# Patient Record
Sex: Male | Born: 1995 | Race: Black or African American | Hispanic: No | Marital: Married | Smoking: Never smoker
Health system: Southern US, Community
[De-identification: ages and names within clinical notes are randomized; demographics above are authoritative.]

---
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
## 2015-12-13 IMAGING — CR DG ANKLE COMPLETE 3+V*R*
3 series · 3 of 3 positions shown · non-contrast
Comparison: Foot radiographs dated 08/09/2010

CLINICAL DATA: Lateral right ankle pain secondary to an injury
while playing basketball yesterday.

EXAM:
RIGHT ANKLE - COMPLETE 3+ VIEW

[ankle ap]
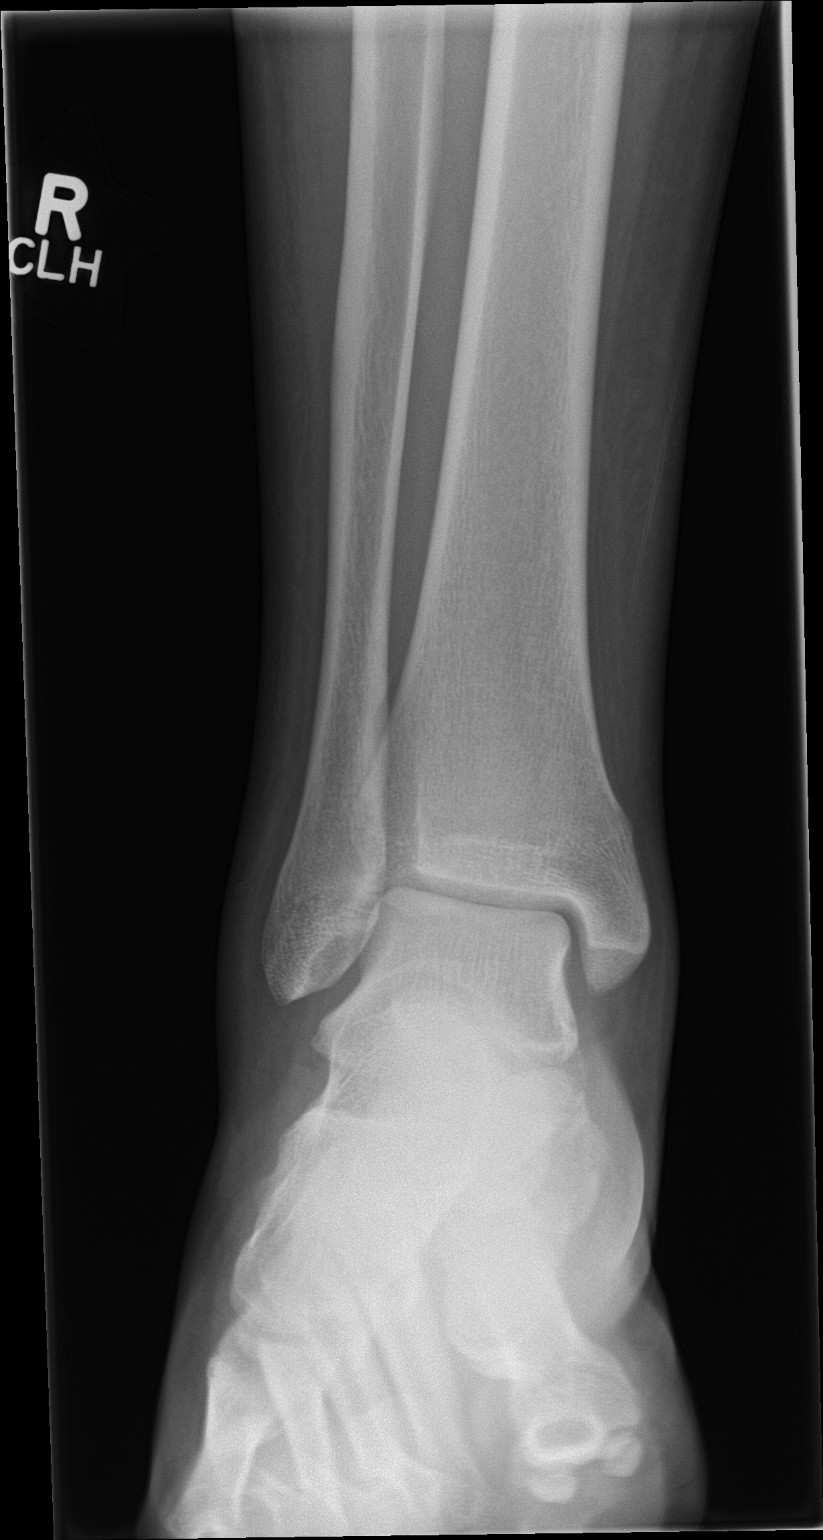

[ankle obl]
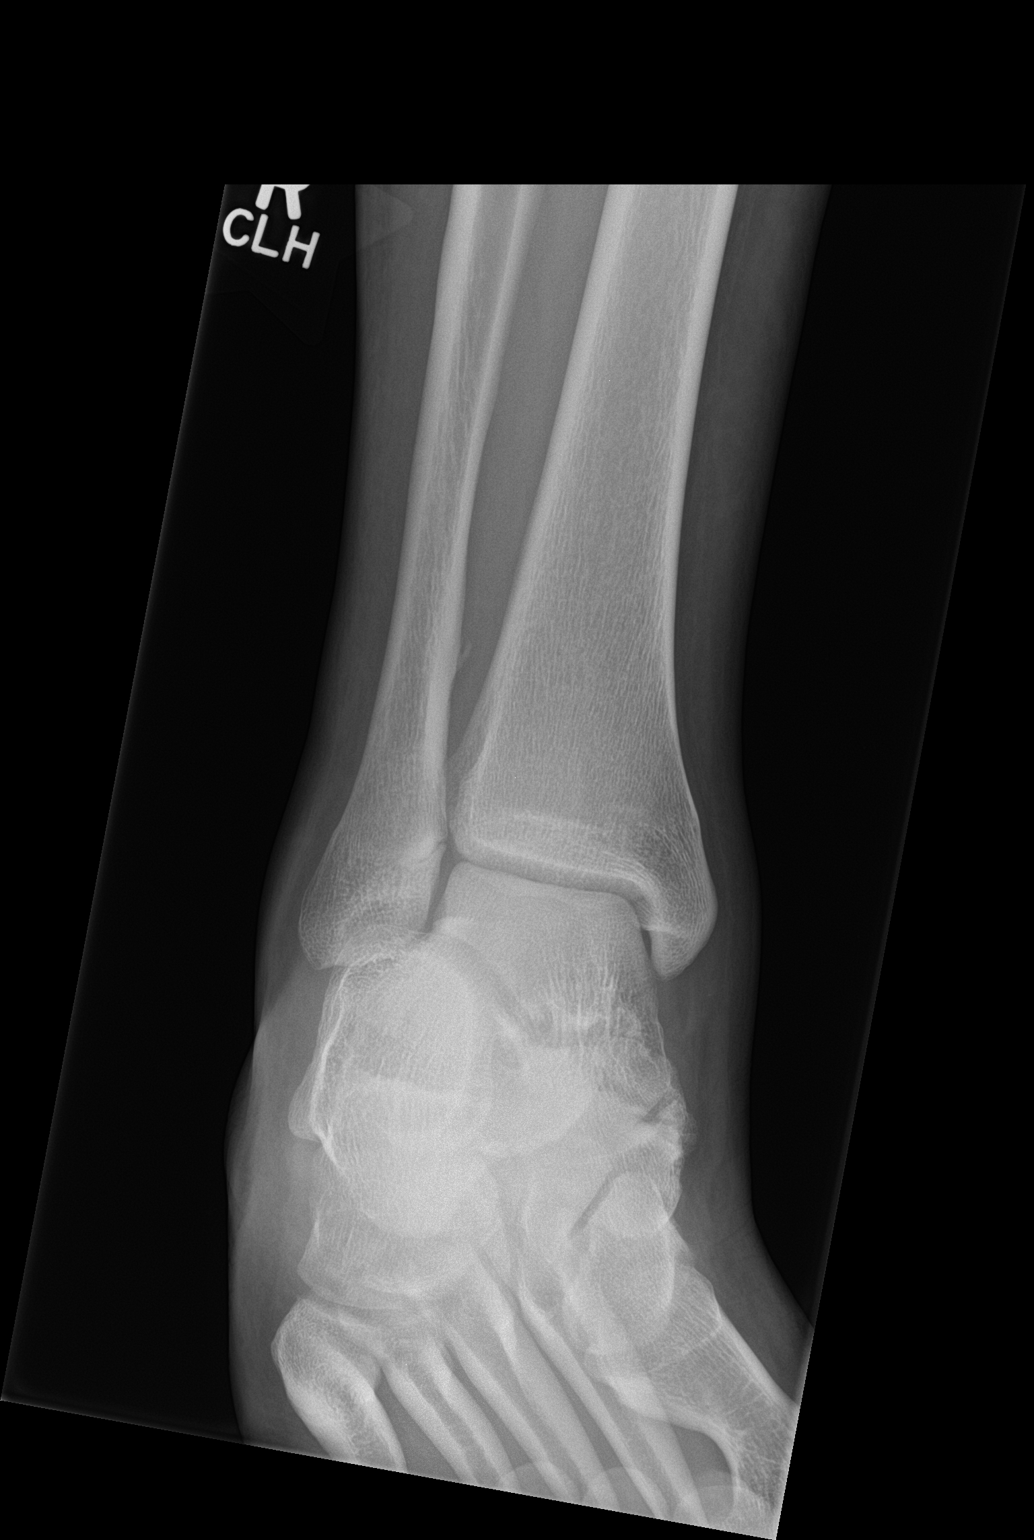

[ankle lat]
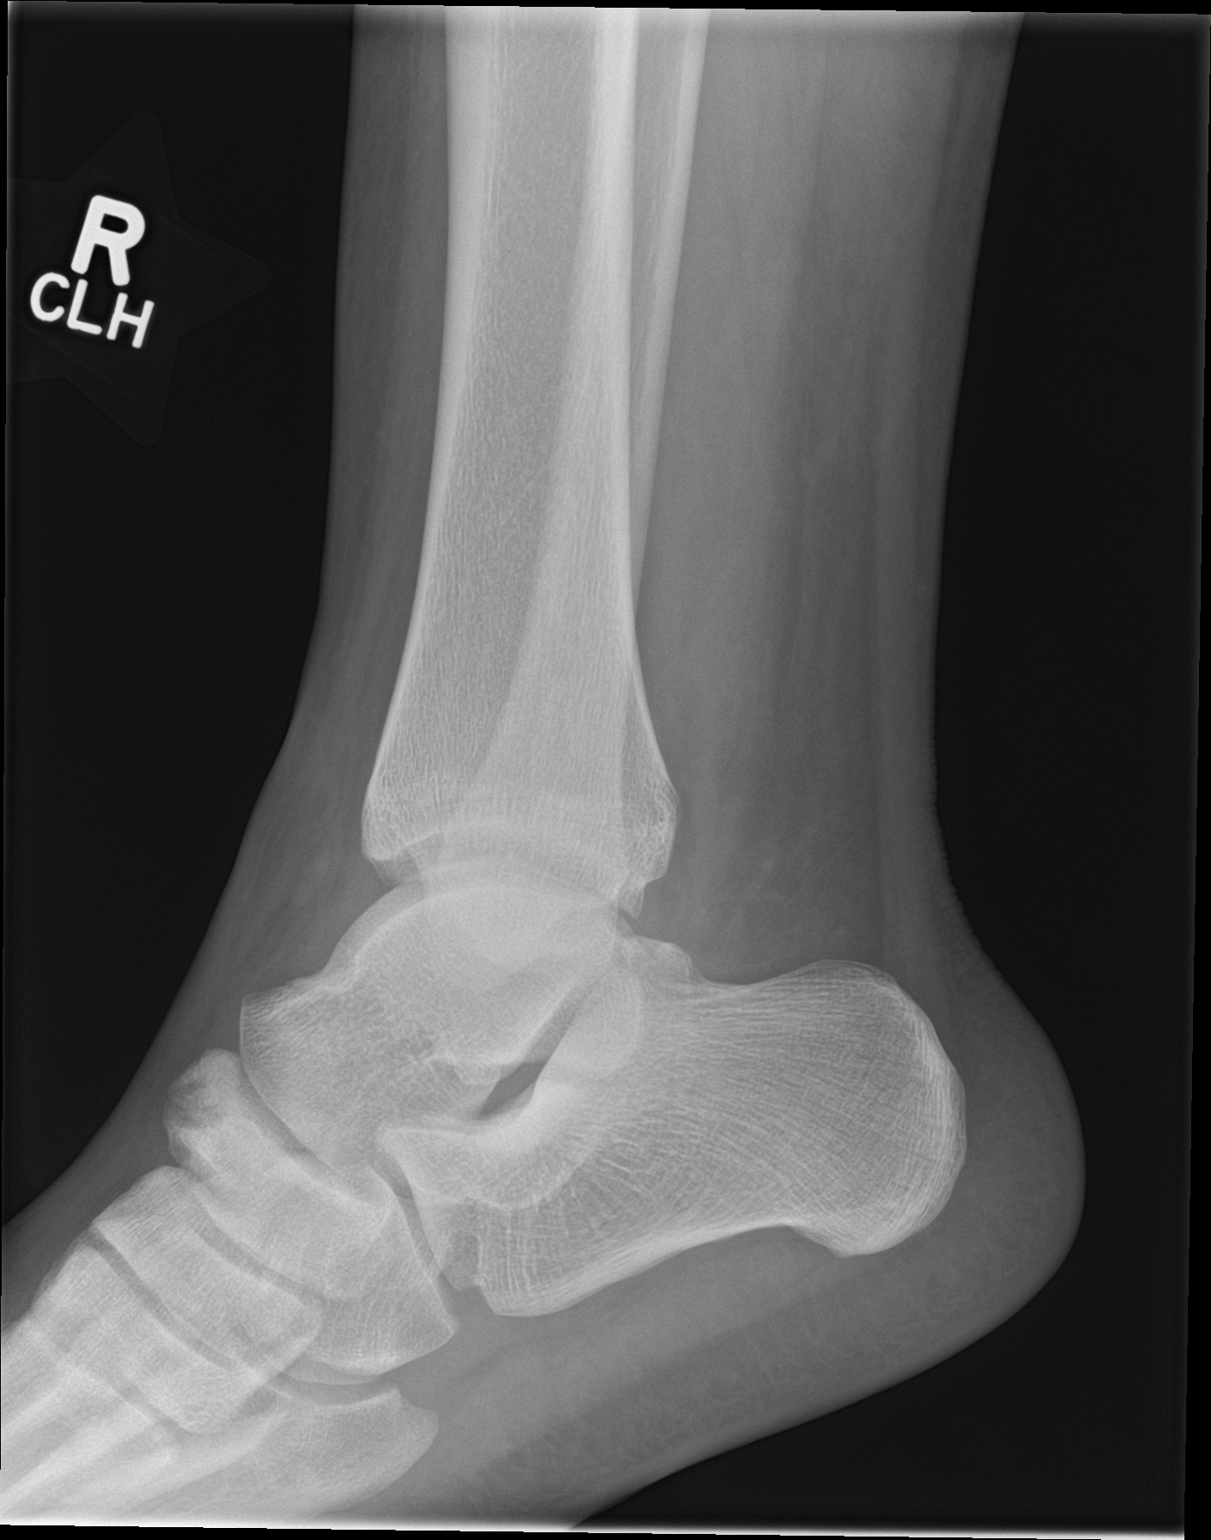

[3 of 3 positions shown; findings below may reference images not displayed]

FINDINGS: There is no fracture or dislocation. There is an accessory
ossification center of the dorsal aspect of the navicular. Small
ankle effusion.
IMPRESSION: No acute osseous abnormality.  Small ankle effusion.

## 2017-03-23 ENCOUNTER — Encounter: Payer: Self-pay | Admitting: Emergency Medicine

## 2017-03-23 ENCOUNTER — Emergency Department
Admission: EM | Admit: 2017-03-23 | Discharge: 2017-03-23 | Disposition: A | Discharge status: Home / Self Care | Payer: BLUE CROSS/BLUE SHIELD | Attending: Emergency Medicine | Admitting: Emergency Medicine

## 2017-03-23 ENCOUNTER — Other Ambulatory Visit: Payer: Self-pay

## 2017-03-23 DIAGNOSIS — H109 Unspecified conjunctivitis: Secondary | ICD-10-CM | POA: Diagnosis present

## 2017-03-23 DIAGNOSIS — H1089 Other conjunctivitis: Secondary | ICD-10-CM | POA: Diagnosis not present

## 2017-03-23 MED ORDER — POLYMYXIN B-TRIMETHOPRIM 10000-0.1 UNIT/ML-% OP SOLN: 1.0000 [drp] | OPHTHALMIC | 0 refills | Status: AC

## 2017-03-23 NOTE — ED Triage Notes (Signed)
Pt c/o "irritation" to left eye X 2 days. No drainage. No vision changes. Wears contacts normally but has been wearing glasses.

## 2017-03-23 NOTE — ED Notes (Signed)
Pt presents with swollen and red left eye. States he wore his contacts too long - he thinks. States he is wearing someone else's glasses. Pt denies change in vision; endorses photosensitivity.

## 2017-03-23 NOTE — ED Provider Notes (Signed)
[facility] Emergency Department Provider Note  ____________________________________________  Time seen: Approximately 6:04 PM  I have reviewed the triage vital signs and the nursing notes.   HISTORY  Chief Complaint Eye Problem    HPI Ryan LevanDarrell K Raechel Acheshley JR is a 21 y.o. male presents to the emergency department with bulbar conjunctivitis of the left eye.  Patient denies pain with extraocular eye muscle movement or blurry vision.  He has experienced mild photophobia and a feeling of "gritty irritation".  Patient wears contact lenses that he has not worn them "here lately".  He has been wearing glasses.  No alleviating measures have been attempted.   History reviewed. No pertinent past medical history.  Patient Active Problem List   Diagnosis Date Noted  . Rhus dermatitis 09/06/2010    History reviewed. No pertinent surgical history.  Prior to Admission medications   Medication Sig Start Date End Date Taking? Authorizing Provider  trimethoprim-polymyxin b (POLYTRIM) ophthalmic solution Place 1 drop every 4 (four) hours for 10 days into the left eye. 03/23/17 04/02/17  Orvil FeilWoods, Wai Litt M, PA-C    Allergies Penicillins  History reviewed. No pertinent family history.  Social History Social History   Tobacco Use  . Smoking status: Never Smoker  . Smokeless tobacco: Never Used  Substance Use Topics  . Alcohol use: No  . Drug use: No     Review of Systems  Constitutional: No fever/chills Eyes: Patient has left eye conjunctivitis. ENT: No upper respiratory complaints. Cardiovascular: no chest pain. Musculoskeletal: Negative for musculoskeletal pain. Skin: Negative for rash, abrasions, lacerations, ecchymosis. Neurological: Negative for headaches, focal weakness or numbness.   ____________________________________________   PHYSICAL EXAM:  VITAL SIGNS: ED Triage Vitals  Enc Vitals Group     BP 03/23/17 1714 133/79     Pulse Rate 03/23/17 1714  66     Resp 03/23/17 1714 18     Temp 03/23/17 1714 98.9 F (37.2 C)     Temp Source 03/23/17 1714 Oral     SpO2 03/23/17 1714 98 %     Weight 03/23/17 1714 295 lb (133.8 kg)     Height 03/23/17 1714 6\' 3"  (1.905 m)     Head Circumference --      Peak Flow --      Pain Score 03/23/17 1751 5     Pain Loc --      Pain Edu? --      Excl. in GC? --      Constitutional: Alert and oriented. Well appearing and in no acute distress. Eyes: Bulbar conjunctivitis of left eye visualized.  PERRL. EOMI. Head: Atraumatic. ENT: Cardiovascular: Normal rate, regular rhythm. Normal S1 and S2.  Good peripheral circulation. Respiratory: Normal respiratory effort without tachypnea or retractions. Lungs CTAB. Good air entry to the bases with no decreased or absent breath sounds. Musculoskeletal: Full range of motion to all extremities. No gross deformities appreciated. Neurologic:  Normal speech and language. No gross focal neurologic deficits are appreciated.  Skin:  Skin is warm, dry and intact. No rash noted. Psychiatric: Mood and affect are normal. Speech and behavior are normal. Patient exhibits appropriate insight and judgement.   ____________________________________________   LABS (all labs ordered are listed, but only abnormal results are displayed)  Labs Reviewed - No data to display ____________________________________________  EKG   ____________________________________________  RADIOLOGY  No results found.  ____________________________________________    PROCEDURES  Procedure(s) performed:    Procedures    Medications - No data to  display   ____________________________________________   INITIAL IMPRESSION / ASSESSMENT AND PLAN / ED COURSE  Pertinent labs & imaging results that were available during my care of the patient were reviewed by me and considered in my medical decision making (see chart for details).  Review of the [place] CSRS was performed in accordance  of the [facility] prior to dispensing any controlled drugs.     Assessment and plan Conjunctivitis Patient presents to the emergency department with bulbar conjunctivitis of the  left eye consistent with bacterial conjunctivitis.  He was discharged with Polytrim ophthalmic solution.  He was advised to return to the emergency department for new or worsening symptoms.  A referral was made to ophthalmology.  Vital signs are reassuring prior to discharge.   ____________________________________________  FINAL CLINICAL IMPRESSION(S) / ED DIAGNOSES  Final diagnoses:  Other conjunctivitis of left eye      NEW MEDICATIONS STARTED DURING THIS VISIT:  ED Discharge Orders        Ordered    trimethoprim-polymyxin b (POLYTRIM) ophthalmic solution  Every 4 hours     03/23/17 1802          This chart was dictated using voice recognition software/Dragon. Despite best efforts to proofread, errors can occur which can change the meaning. Any change was purely unintentional.    Orvil FeilWoods, Ryan Steele M, PA-C 03/23/17 1807    Jeanmarie PlantMcShane, James A, MD 03/23/17 1946

## 2017-06-28 ENCOUNTER — Ambulatory Visit: Payer: Self-pay | Admitting: Family Medicine

## 2017-08-09 ENCOUNTER — Ambulatory Visit: Payer: Self-pay | Admitting: Family Medicine

## 2017-11-20 ENCOUNTER — Encounter: Payer: Self-pay | Admitting: Emergency Medicine

## 2017-11-20 ENCOUNTER — Emergency Department
Admission: EM | Admit: 2017-11-20 | Discharge: 2017-11-20 | Disposition: A | Discharge status: Home / Self Care | Payer: BLUE CROSS/BLUE SHIELD | Attending: Emergency Medicine | Admitting: Emergency Medicine

## 2017-11-20 ENCOUNTER — Other Ambulatory Visit: Payer: Self-pay

## 2017-11-20 DIAGNOSIS — L731 Pseudofolliculitis barbae: Secondary | ICD-10-CM | POA: Diagnosis not present

## 2017-11-20 DIAGNOSIS — R21 Rash and other nonspecific skin eruption: Secondary | ICD-10-CM | POA: Diagnosis present

## 2017-11-20 DIAGNOSIS — L738 Other specified follicular disorders: Secondary | ICD-10-CM

## 2017-11-20 MED ORDER — CLINDAMYCIN HCL 150 MG PO CAPS: 300.0000 mg | ORAL_CAPSULE | Freq: Three times a day (TID) | ORAL | 0 refills | Status: AC

## 2017-11-20 MED ORDER — CLINDAMYCIN PHOSPHATE 1 % EX SOLN: CUTANEOUS | 0 refills | Status: AC

## 2017-11-20 NOTE — ED Provider Notes (Signed)
[facility] Emergency Department Provider Note  ____________________________________________   First MD Initiated Contact with Patient 11/20/17 1640     (approximate)  I have reviewed the triage vital signs and the nursing notes.   HISTORY  Chief Complaint No chief complaint on file.   HPI Ryan SkeensDarrell K Gaddie Jr. is a 22 y.o. male is here with complaint of face breaking out after he shaved a couple days ago.  Patient states he "dry shaved".  Since that time he has had areas that have broken out and have been slightly painful.  Patient denies any fever or chills.  He denies any health problems such as diabetes.  He has not used any over-the-counter medication on his face.   History reviewed. No pertinent past medical history.  Patient Active Problem List   Diagnosis Date Noted  . Rhus dermatitis 09/06/2010    History reviewed. No pertinent surgical history.  Prior to Admission medications   Medication Sig Start Date End Date Taking? Authorizing Provider  clindamycin (CLEOCIN) 150 MG capsule Take 2 capsules (300 mg total) by mouth 3 (three) times daily for 7 days. 11/20/17 11/27/17  Tommi RumpsSummers, Adelena Desantiago L, PA-C  clindamycin (CLEOCIN-T) 1 % external solution Apply to affected area 2 times daily 11/20/17 11/20/18  Tommi RumpsSummers, Tationa Stech L, PA-C    Allergies Penicillins  No family history on file.  Social History Social History   Tobacco Use  . Smoking status: Never Smoker  . Smokeless tobacco: Never Used  Substance Use Topics  . Alcohol use: No  . Drug use: No    Review of Systems Constitutional: No fever/chills Eyes: No visual changes. ENT: No sore throat. Cardiovascular: Denies chest pain. Respiratory: Denies shortness of breath. Musculoskeletal: Negative for back pain. Skin: Positive for facial rash. Neurological: Negative for headaches, focal weakness or numbness. ____________________________________________   PHYSICAL EXAM:  VITAL SIGNS: ED  Triage Vitals  Enc Vitals Group     BP 11/20/17 1639 116/68     Pulse Rate 11/20/17 1639 (!) 56     Resp 11/20/17 1639 18     Temp 11/20/17 1639 98.2 F (36.8 C)     Temp Source 11/20/17 1639 Oral     SpO2 11/20/17 1639 98 %     Weight 11/20/17 1640 300 lb (136.1 kg)     Height 11/20/17 1640 6\' 2"  (1.88 m)     Head Circumference --      Peak Flow --      Pain Score 11/20/17 1640 0     Pain Loc --      Pain Edu? --      Excl. in GC? --    Constitutional: Alert and oriented. Well appearing and in no acute distress. Eyes: Conjunctivae are normal.  Head: Atraumatic. Neck: No stridor.   Hematological/Lymphatic/Immunilogical: No cervical lymphadenopathy. Cardiovascular: Normal rate, regular rhythm. Grossly normal heart sounds.  Good peripheral circulation. Respiratory: Normal respiratory effort.  No retractions. Lungs CTAB. Musculoskeletal: Moves upper and lower extremities without any difficulty.  Normal gait was noted. Neurologic:  Normal speech and language. No gross focal neurologic deficits are appreciated. No gait instability. Skin:  Skin is warm, dry.  There are multiple papular lesions in the beard area of the patient.  Some these areas are open but no active drainage is noted.  There is an area on the right lower mandibular area that has crusted over.  Rash is consistent with folliculitis barbae.   Psychiatric: Mood and affect are normal. Speech  and behavior are normal.  ____________________________________________   LABS (all labs ordered are listed, but only abnormal results are displayed)  Labs Reviewed - No data to display   PROCEDURES  Procedure(s) performed: None  Procedures  Critical Care performed: No  ____________________________________________   INITIAL IMPRESSION / ASSESSMENT AND PLAN / ED COURSE  As part of my medical decision making, I reviewed the following data within the electronic MEDICAL RECORD NUMBER Notes from prior ED visits and [place] Controlled  Substance Database  Patient was made aware that he is not to shave until his skin has healed.  We also discussed dry shaving and how he should not use the same razor after his face is cleared.  Patient was started on clindamycin for the next 7 days and also Cleocin T solution to apply to his face twice a day.  Patient is to follow-up with his PCP or [facility] if any continued problems. ____________________________________________   FINAL CLINICAL IMPRESSION(S) / ED DIAGNOSES  Final diagnoses:  Folliculitis barbae traumatica     ED Discharge Orders        Ordered    clindamycin (CLEOCIN) 150 MG capsule  3 times daily     11/20/17 1714    clindamycin (CLEOCIN-T) 1 % external solution     11/20/17 1714       Note:  This document was prepared using Dragon voice recognition software and may include unintentional dictation errors.    Tommi Rumps, PA-C 11/20/17 Konrad Felix, MD 11/20/17 [phone]

## 2017-11-20 NOTE — Discharge Instructions (Addendum)
Begin taking antibiotics as directed.  Take 2 capsules 3 times a day for the next 7 days.  Also clindamycin solution is to be applied to your face twice a day.  Do not shave until your face is completely clear. Follow-up with your primary care provider if any continued problems or follow-up with 1 of the dermatologist at [facility].

## 2017-11-20 NOTE — ED Triage Notes (Signed)
PT to ED via POV with c/o " face breaking out from shaving" xfew days. PT denies any allergies to creams, states may have used old razors.NAD noted

## 2019-06-04 ENCOUNTER — Emergency Department
Admission: EM | Admit: 2019-06-04 | Discharge: 2019-06-04 | Disposition: A | Discharge status: Home / Self Care | Payer: BLUE CROSS/BLUE SHIELD | Attending: Emergency Medicine | Admitting: Emergency Medicine

## 2019-06-04 ENCOUNTER — Encounter: Payer: Self-pay | Admitting: Emergency Medicine

## 2019-06-04 ENCOUNTER — Other Ambulatory Visit: Payer: Self-pay

## 2019-06-04 DIAGNOSIS — R350 Frequency of micturition: Secondary | ICD-10-CM

## 2019-06-04 LAB — URINALYSIS, COMPLETE (UACMP) WITH MICROSCOPIC
Bacteria, UA: NONE SEEN
Bilirubin Urine: NEGATIVE
Glucose, UA: NEGATIVE mg/dL
Hgb urine dipstick: NEGATIVE
Ketones, ur: NEGATIVE mg/dL
Nitrite: NEGATIVE
Protein, ur: NEGATIVE mg/dL
Specific Gravity, Urine: 1.019 (ref 1.005–1.030)
Squamous Epithelial / LPF: NONE SEEN (ref 0–5)
WBC, UA: >50 WBC/hpf — ABNORMAL HIGH (ref 0–5)
pH: 6 (ref 5.0–8.0)

## 2019-06-04 NOTE — ED Triage Notes (Signed)
Pt here with c/o urinary urgency and frequency over the past week. Denies blood in urine, denies hx kidney stones. NAD.

## 2019-06-04 NOTE — ED Provider Notes (Signed)
[facility] Emergency Department Provider Note   ____________________________________________   First MD Initiated Contact with Patient 06/04/19 [phone]     (approximate)  I have reviewed the triage vital signs and the nursing notes.   HISTORY  Chief Complaint Urinary Urgency and Urinary Frequency    HPI Ryan Steele. is a 24 y.o. male patient presents with 1 week of urinary urgency and frequency.  Patient denies urethral discharge.  Patient denies dysuria.  Patient denies hematuria.  No history of kidney stones.  Patient last sexual contact was greater than 2 weeks ago.  Patient denies history of diabetes.         History reviewed. No pertinent past medical history.  Patient Active Problem List   Diagnosis Date Noted  . Rhus dermatitis 09/06/2010    History reviewed. No pertinent surgical history.  Prior to Admission medications   Not on File    Allergies Penicillins  No family history on file.  Social History Social History   Tobacco Use  . Smoking status: Never Smoker  . Smokeless tobacco: Never Used  Substance Use Topics  . Alcohol use: No  . Drug use: No    Review of Systems Constitutional: No fever/chills Eyes: No visual changes. ENT: No sore throat. Cardiovascular: Denies chest pain. Respiratory: Denies shortness of breath. Gastrointestinal: No abdominal pain.  No nausea, no vomiting.  No diarrhea.  No constipation. Genitourinary: Negative for dysuria. Musculoskeletal: Negative for back pain. Skin: Negative for rash. Neurological: Negative for headaches, focal weakness or numbness. Allergic/Immunilogical: Penicillin ____________________________________________   PHYSICAL EXAM:  VITAL SIGNS: ED Triage Vitals  Enc Vitals Group     BP 06/04/19 0910 133/75     Pulse Rate 06/04/19 0910 67     Resp 06/04/19 0910 17     Temp 06/04/19 0910 98.2 F (36.8 C)     Temp Source 06/04/19 0910 Oral     SpO2 06/04/19  0910 96 %     Weight 06/04/19 0910 293 lb (132.9 kg)     Height 06/04/19 0910 6\' 2"  (1.88 m)     Head Circumference --      Peak Flow --      Pain Score 06/04/19 0915 3     Pain Loc --      Pain Edu? --      Excl. in [place]? --     Constitutional: Alert and oriented. Well appearing and in no acute distress. Cardiovascular: Normal rate, regular rhythm. Grossly normal heart sounds.  Good peripheral circulation. Respiratory: Normal respiratory effort.  No retractions. Lungs CTAB. Gastrointestinal: Soft and nontender. No distention. No abdominal bruits. No CVA tenderness. Genitourinary: No lesions or urethral discharge. Neurologic:  Normal speech and language. No gross focal neurologic deficits are appreciated. No gait instability. Skin:  Skin is warm, dry and intact. No rash noted. Psychiatric: Mood and affect are normal. Speech and behavior are normal.  ____________________________________________   LABS (all labs ordered are listed, but only abnormal results are displayed)  Labs Reviewed  URINALYSIS, COMPLETE (UACMP) WITH MICROSCOPIC - Abnormal; Notable for the following components:      Result Value   Color, Urine YELLOW (*)    APPearance CLEAR (*)    Leukocytes,Ua MODERATE (*)    WBC, UA >50 (*)    All other components within normal limits  GC/CHLAMYDIA PROBE AMP   ____________________________________________  EKG   ____________________________________________  RADIOLOGY  ED MD interpretation:    Official radiology report(s): No  results found.  ____________________________________________   PROCEDURES  Procedure(s) performed (including Critical Care):  Procedures   ____________________________________________   INITIAL IMPRESSION / ASSESSMENT AND PLAN / ED COURSE  As part of my medical decision making, I reviewed the following data within the electronic MEDICAL RECORD NUMBER     Patient presents for urinary frequency fever greater than 1 week.  Patient denies  urethral discharge.  Discussed urinalysis showed increased white blood cells and no bacteria.  Patient GC and chlamydia is..  Patient will follow up with the MyChart app for lab results.  Patient advised to be notified telephonically if it is positive for chlamydia/gonorrhea.    Ryan Steele. was evaluated in Emergency Department on 06/04/2019 for the symptoms described in the history of present illness. He was evaluated in the context of the global COVID-19 pandemic, which necessitated consideration that the patient might be at risk for infection with the SARS-CoV-2 virus that causes COVID-19. Institutional protocols and algorithms that pertain to the evaluation of patients at risk for COVID-19 are in a state of rapid change based on information released by regulatory bodies including the CDC and federal and state organizations. These policies and algorithms were followed during the patient's care in the ED.       ____________________________________________   FINAL CLINICAL IMPRESSION(S) / ED DIAGNOSES  Final diagnoses:  Urinary frequency     ED Discharge Orders    None       Note:  This document was prepared using Dragon voice recognition software and may include unintentional dictation errors.    Joni Reining, PA-C 06/04/19 1118    Sharman Cheek, MD 06/04/19 [phone]

## 2019-06-04 NOTE — Discharge Instructions (Addendum)
Your urine shows that there is an infection.  Your GC/committee results are pending.  You will be notified telephonically of positive results.  You may also follow the lab results in the MyChart app.

## 2019-06-06 ENCOUNTER — Telehealth: Payer: Self-pay | Admitting: Emergency Medicine

## 2019-06-06 LAB — GC/CHLAMYDIA PROBE AMP
Chlamydia trachomatis, NAA: NEGATIVE
Neisseria Gonorrhoeae by PCR: POSITIVE — AB

## 2019-06-06 NOTE — Telephone Encounter (Signed)
Called patient to inform of std test restult.  Needs further treatment --available at [facility], pcp, urgent care.  May return here if needed.  No answer and no voicemail set up. Will try again tomorrow.

## 2019-06-07 NOTE — Telephone Encounter (Signed)
Called patient again regarding std result

## 2019-06-07 NOTE — Telephone Encounter (Signed)
Called patient back.  Gave result of test and he agrees to call [facility] to see if they can treat today.  He is aware of other options for treatment-urgent care, pcp and here.  Also discussed partner treatment

## 2019-06-07 NOTE — Telephone Encounter (Signed)
Pt called back to follow up on this test.  Advised pt someone will call him back. Please call   [phone]
